# Patient Record
Sex: Male | Born: 1970 | Hispanic: Refuse to answer
Health system: Midwestern US, Community
[De-identification: ages and names within clinical notes are randomized; demographics above are authoritative.]

---

## 2019-07-24 LAB — BASIC METABOLIC PANEL
Anion Gap: 11 mmol/L (ref 2–17)
BUN: 13 mg/dL (ref 6–20)
CO2: 25 mmol/L (ref 22–29)
Calcium: 9.1 mg/dL (ref 8.6–10.0)
Chloride: 102 mmol/L (ref 98–107)
Creatinine: 1 mg/dL (ref 0.7–1.3)
GFR African American: 102 mL/min/{1.73_m2} (ref >=90)
GFR Non-African American: 88 mL/min/{1.73_m2} — ABNORMAL LOW (ref >=90)
Glucose: 135 mg/dL — ABNORMAL HIGH (ref 70–99)
OSMOLALITY CALCULATED: 278 mOsm/kg (ref 270–287)
Potassium: 4 mmol/L (ref 3.5–5.3)
Sodium: 138 mmol/L (ref 135–145)

## 2019-07-24 LAB — CBC WITH AUTO DIFFERENTIAL
Absolute Baso #: 0 10*3/uL (ref 0.0–0.2)
Absolute Eos #: 0.2 10*3/uL (ref 0.0–0.5)
Absolute Lymph #: 2.9 10*3/uL (ref 1.0–3.2)
Absolute Mono #: 0.6 10*3/uL (ref 0.3–1.0)
Basophils %: 0.2 % (ref 0.0–2.0)
Eosinophils %: 3 % (ref 0.0–7.0)
Hematocrit: 39.6 % (ref 38.0–52.0)
Hemoglobin: 12.8 g/dL — ABNORMAL LOW (ref 13.0–17.3)
Immature Grans (Abs): 0.04 10*3/uL (ref 0.00–0.06)
Immature Granulocytes: 0.5 % (ref 0.1–0.6)
Lymphocytes: 35.8 % (ref 15.0–45.0)
MCH: 22.1 pg — ABNORMAL LOW (ref 27.0–34.5)
MCHC: 32.3 g/dL (ref 32.0–36.0)
MCV: 68.3 fL — ABNORMAL LOW (ref 84.0–100.0)
MPV: 10.9 fL (ref 7.2–13.2)
Monocytes: 7.5 % (ref 4.0–12.0)
Neutrophils %: 53 % (ref 42.0–74.0)
Neutrophils Absolute: 4.3 10*3/uL (ref 1.6–7.3)
Platelets: 278 10*3/uL (ref 140–440)
RBC: 5.8 x10e6/mcL — ABNORMAL HIGH (ref 4.00–5.60)
RDW: 17.1 % — ABNORMAL HIGH (ref 11.0–16.0)
WBC: 8.1 10*3/uL (ref 3.8–10.6)

## 2019-07-24 LAB — MORPHOLOGY CHECK

## 2019-07-24 LAB — TROPONIN T
Troponin T: <0.01 ng/mL (ref 0.000–0.010)
Troponin T: <0.01 ng/mL (ref 0.000–0.010)

## 2019-07-24 NOTE — ED Notes (Signed)
ED Triage Note       ED Secondary Triage Entered On:  07/24/2019 0:32 EDT    Performed On:  07/24/2019 0:30 EDT by Jeral Pinch, RN, Loranne               General Information   Barriers to Learning :   None evident   ED Home Meds Section :   Document assessment   [facility] ED Fall Risk Section :   Document assessment   ED Advance Directives Section :   Document assessment   ED Palliative Screen :   N/A (prefilled for <49yo)   Creel, RN, Loranne - 07/24/2019 0:30 EDT   (As Of: 07/24/2019 00:32:16 EDT)   Diagnoses(Active)    Acute chest pain  Date:   07/24/2019 ; Diagnosis Type:   Discharge ; Confirmation:   Confirmed ; Clinical Dx:   Acute chest pain ; Classification:   Medical ; Clinical Service:   Non-Specified ; Code:   ICD-10-CM ; Probability:   0 ; Diagnosis Code:   R07.9      Chest pain  Date:   07/24/2019 ; Diagnosis Type:   Reason For Visit ; Confirmation:   Complaint of ; Clinical Dx:   Chest pain ; Classification:   Medical ; Clinical Service:   Emergency medicine ; Code:   PNED ; Probability:   0 ; Diagnosis Code:   [id number]             -    Procedure History   (As Of: 07/24/2019 00:32:16 EDT)     Phoebe Perch Fall Risk Assessment Tool   Hx of falling last 3 months ED Fall :   No   Patient confused or disoriented ED Fall :   No   Patient intoxicated or sedated ED Fall :   No   Patient impaired gait ED Fall :   No   Use a mobility assistance device ED Fall :   No   Patient altered elimination ED Fall :   No   [facility] ED Fall Score :   0    Creel, RN, Loranne - 07/24/2019 0:30 EDT   ED Advance Directive   Advance Directive :   No   Creel, RN, Jerold Coombe - 07/24/2019 0:30 EDT   Med Hx   Medication List   (As Of: 07/24/2019 00:32:16 EDT)   Normal Order    ketorolac 15 mg/mL Inj Soln 1 mL  :   ketorolac 15 mg/mL Inj Soln 1 mL ; Status:   Completed ; Ordered As Mnemonic:   Toradol ; Simple Display Line:   15 mg, 1 mL, IV Push, Once ; Ordering Provider:   Bernestine Amass; Catalog Code:    ketorolac ; Order Dt/Tm:   07/24/2019 00:22:51 EDT          Sodium Chloride 0.9% intravenous solution Bolus  :   Sodium Chloride 0.9% intravenous solution Bolus ; Status:   Completed ; Ordered As Mnemonic:   Sodium Chloride 0.9% bolus ; Simple Display Line:   1,000 mL, 2000 mL/hr, IV Piggyback, Once ; Ordering Provider:   Bernestine Amass; Catalog Code:   Sodium Chloride 0.9% ; Order Dt/Tm:   07/24/2019 00:22:51 EDT            Home Meds    aspirin  :   aspirin ; Status:   Documented ; Ordered As Mnemonic:   aspirin ; Simple Display Line:   325 mg, Oral, Once, 0 Refill(s) ;  Catalog Code:   aspirin ; Order Dt/Tm:   07/24/2019 00:31:21 EDT          atorvastatin  :   atorvastatin ; Status:   Documented ; Ordered As Mnemonic:   atorvastatin ; Simple Display Line:   Oral, Daily, 0 Refill(s) ; Catalog Code:   atorvastatin ; Order Dt/Tm:   07/24/2019 64:40:34 EDT          folic acid  :   folic acid ; Status:   Documented ; Ordered As Mnemonic:   folic acid ; Simple Display Line:   Daily, 0 Refill(s) ; Catalog Code:   folic acid ; Order Dt/Tm:   07/24/2019 00:31:28 EDT          nitroglycerin  :   nitroglycerin ; Status:   Documented ; Ordered As Mnemonic:   nitroglycerin ; Simple Display Line:   0 Refill(s) ; Catalog Code:   nitroglycerin ; Order Dt/Tm:   07/24/2019 00:31:47 EDT

## 2019-07-24 NOTE — ED Notes (Signed)
 ED Patient Summary       ;       [facility] Emergency Department  [address] [place], [place] [address]  [phone]  Discharge Instructions (Patient)  _______________________________________     Colin Williams  DOB:  December 17, 1970                   MRN: [id number]                   FIN: [id number]  Reason For Visit: Chest pain; CHEST PAIN  Final Diagnosis: Acute chest pain     Visit Date: 07/24/2019 00:19:00  Address: [address] [place] [address]  Phone: [phone]     Emergency Department Providers:         Primary Physician:   ROYDEN Colin Williams         [facility] would like to thank you for allowing us  to assist you with your healthcare needs. The following includes patient education materials and information regarding your injury/illness.     Follow-up Instructions:  You were seen today on an emergency basis. Please contact your primary care doctor for a follow up appointment. If you received a referral to a specialist doctor, it is important you follow-up as instructed.    It is important that you call your follow-up doctor to schedule and confirm the location of your next appointment. Your doctor may practice at multiple locations. The office location of your follow-up appointment may be different to the one written on your discharge instructions.    If you do not have a primary care doctor, please call [phone] for help in finding a [facility] Provider. For help in finding a specialist doctor, please call [phone].    The [facility] "Ask a Nurse" line in staffed by Registered Nurses and is a free service to the community. We are available Monday - Friday from 8am to 5pm to answer your questions about your health. Please call [phone].    If your condition gets worse before your follow-up with your primary care doctor or specialist, please return to the Emergency Department.        Follow Up  Appointments:  Primary Care Provider:      Name: PCP,  UNKNOWN      Phone:                  With: Address: When:   primary care physician or clinic  Within 2 to 4 days   Comments:   Please follow up with your primary care doctor as soon as possible.     If you do not have a primary care doctor, please call [facility] Referral line at [phone]     Please return for any worsening or new concerning symptoms.       With: Address: When:   [facility] Call for appt & office location   [phone] Business (1) Within 1 to 2 days              Printed Prescriptions:    Patient Education Materials:  Discharge Orders          Discharge Patient 07/24/19 2:53:00 EDT         Comment:      Nonspecific Chest Pain, Easy-to-Read     Nonspecific Chest Pain    It is often hard to find the cause of chest pain. There  is always a chance that your pain could be related to something serious, such as a heart attack or a blood clot in your lungs. Chest pain can also be caused by conditions that are not life-threatening. If you have chest pain, it is very important to follow up with your doctor.          HOME CARE     If you were prescribed an antibiotic medicine, finish it all even if you start to feel better.     Avoid any activities that cause chest pain.     Do not use any tobacco products, including cigarettes, chewing tobacco, or electronic cigarettes. If you need help quitting, ask your doctor.     Do not drink alcohol.     Take medicines only as told by your doctor.     Keep all follow-up visits as told by your doctor. This is important. This includes any further testing if your chest pain does not go away.     Your doctor may tell you to keep your head raised (elevated) while you sleep.     Make lifestyle changes as told by your doctor. These may include:    ? Getting regular exercise. Ask your doctor to suggest some activities that are safe for you.    ? Eating a heart-healthy diet. Your doctor or a diet specialist  (dietitian) can help you to learn healthy eating options.    ? Maintaining a healthy weight.    ? Managing diabetes, if necessary.    ? Reducing stress.    GET HELP IF:     Your chest pain does not go away, even after treatment.     You have a rash with blisters on your chest.     You have a fever.    GET HELP RIGHT AWAY IF:     Your chest pain is worse.      You have an increasing cough, or you cough up blood.     You have severe belly (abdominal) pain.     You feel extremely weak.     You pass out (faint).     You have chills.     You have sudden, unexplained chest discomfort.     You have sudden, unexplained discomfort in your arms, back, neck, or jaw.     You have shortness of breath at any time.     You suddenly start to sweat, or your skin gets clammy.     You feel nauseous.     You vomit.     You suddenly feel light-headed or dizzy.     Your heart begins to beat quickly, or it feels like it is skipping beats.    These symptoms may be an emergency. Do not wait to see if the symptoms will go away. Get medical help right away. Call your local emergency services (911 in the [place] Do not drive yourself to the hospital.    This information is not intended to replace advice given to you by your health care provider. Make sure you discuss any questions you have with your health care provider.    Document Released: 03/01/2008 Document Revised: 10/04/2014 Document Reviewed: 04/19/2014  Elsevier Interactive Patient Education ?2016 Elsevier Inc.         Allergy Info: No Known Allergies     Medication Information:  [facility] ED Physicians provided you with a complete list of medications post discharge, if you have been instructed to  stop taking a medication please ensure you also follow up with this information to your Primary Care Physician.  Unless otherwise noted, patient will continue to take medications as prescribed prior to the Emergency Room visit.  Any specific questions regarding your chronic  medications and dosages should be discussed with your physician(s) and pharmacist.          aspirin 325 Milligram Oral (given by mouth) once.  atorvastatin Oral (given by mouth) every day.  folic acid every day.  naproxen (Naprosyn 500 mg oral tablet) 1 Tabs Oral (given by mouth) 2 times a day for 7 Days. Refills: 0.  nitroglycerin      Medications Administered During Visit:              Medication Dose Route   ketorolac 15 mg IV Push   Sodium Chloride 0.9% 1000 mL IV Piggyback   morphine 4 mg IV Push          Major Tests and Procedures:  The following procedures and tests were performed during your ED visit.  COMMON PROCEDURES%>  COMMON PROCEDURES COMMENTS%>          Laboratory Orders  Name Status Details   BMP Completed Blood, Stat, ST - Stat, 07/24/19 0:21:00 EDT, 07/24/19 0:21:00 EDT, Nurse collect, KWON-MD,  Colin Williams, Print label Y/N   CBCDIFF Completed Blood, Stat, ST - Stat, 07/24/19 0:21:00 EDT, 07/24/19 0:21:00 EDT, Nurse collect, ROYDEN,  Colin Williams, Print label Y/N   Morphology Review Completed Blood, Stat, ST - Stat, 07/24/19 0:21:00 EDT, 07/24/19 0:21:00 EDT, Nurse collect, 07/24/19 0:24:00 US Robinette ROYDEN Colin Williams, [phone]   Trop T Completed Blood, Routine, RT - Routine, 07/24/19 2:21:00 EDT, 07/24/19 2:21:00 EDT, Nurse collect, KWON-MD,  Colin Williams, Print label Y/N   Trop T Completed Blood, Stat, ST - Stat, 07/24/19 0:21:00 EDT, 07/24/19 0:21:00 EDT, Nurse collect, ROYDEN,  Colin Williams, Print label Y/N               Radiology Orders  Name Status Details   XR Chest 1 View Portable Ordered 07/24/19 0:21:00 EDT, STAT 1 hour or less, Reason: Chest pain, Transport Mode: PORTABLE, pp_set_radiology_subspecialty               Patient Care Orders  Name Status Details   Blood Pressure Completed 07/24/19 0:21:00 EDT, Once, 07/24/19 0:21:00 EDT, Check BP both arms   Cardiac/NIBP/Pulse Ox Monitoring Completed 07/24/19 0:21:00 EDT, This message can only be seen by  Nursing, it is not visible to Pharmacy, Laboratory, or Radiology., 07/24/19 0:21:00 EDT, 07/24/19 0:21:00 EDT, Once   Discharge Patient Ordered 07/24/19 2:53:00 EDT   ED Assessment Adult Completed 07/24/19 0:23:51 EDT, 07/24/19 0:23:51 EDT   ED Only Oxygen Therapy Completed 07/24/19 0:21:00 EDT, STAT 1 hour or less, 07/24/19 0:21:00 EDT, Keep SAT > 92%   ED Secondary Triage Completed 07/24/19 0:23:51 EDT, 07/24/19 0:23:51 EDT   ED Triage Adult Completed 07/24/19 0:19:56 EDT, 07/24/19 0:19:56 EDT   Saline Lock Insert Completed 07/24/19 0:21:00 EDT, Once, 07/24/19 0:21:00 EDT       ---------------------------------------------------------------------------------------------------------------------  [facility]) encourages you to self-enroll in the [facility] Patient Portal.  [facility] Patient Portal will allow you to manage your personal health information securely from your own electronic device now and in the future.  To begin your Patient Portal enrollment process, please visit [email or web address] Click on "Sign up now" under [facility].  If you find that you need additional assistance on the [facility] Patient Portal or need a copy of your medical records, please call the [facility] Medical Records Office at [phone].  Comment:

## 2019-07-24 NOTE — ED Provider Notes (Signed)
chest pain *ED        Patient:   Colin Williams, Colin Williams             MRN: [id number]            FIN: [id number]               Age:   48 years     Sex:  Male     DOB:  [birth year]   Associated Diagnoses:   Acute chest pain   Author:   Adora Fridge      Basic Information   Time seen: Provider Seen (ST)   ED Provider/Time:    Adora Fridge / 07/24/2019 00:21  .   Additional information: Chief Complaint from Nursing Triage Note   Chief Complaint  Chief Complaint: Pt c/o substernal CP with SOB since 1300 on 10/26.  Pt given NTG and ASA per medics. (07/24/19 00:20:00).      History of Present Illness   The patient presents with 48 year old male past medical history of nonobstructive CAD on cath on 10/2018 presenting with left-sided chest pain since 1 PM.  Started at rest, not better or worse with anything.  No radiation, has some mild sob, no other associated symptoms. BIBEMS, gave aspirin and nitro, pt had no relief and symptoms still persist. No history of blood clots, no long travels or recent surgeries, no OCPs, no leg pain or swelling. Denies headache, abdominal pain, fevers, chills, cough, runny nose, dysuria, bleeding from anywhere..  The onset was 10  hours ago.  The course/duration of symptoms is fluctuating in intensity.  Location: chest. The character of symptoms is pain.  The degree at onset was minimal.  The degree at present is minimal.        Review of Systems   Constitutional symptoms:  Negative except as documented in HPI.   Skin symptoms:  Negative except as documented in HPI.   Eye symptoms:  Negative except as documented in HPI.   ENMT symptoms:  Negative except as documented in HPI.   Respiratory symptoms:  Negative except as documented in HPI.   Cardiovascular symptoms:  Negative except as documented in HPI.   Gastrointestinal symptoms:  Negative except as documented in HPI.   Musculoskeletal symptoms:  Negative except as documented in HPI.   Neurologic symptoms:  Negative except as  documented in HPI.   Psychiatric symptoms:  Negative except as documented in HPI.             Additional review of systems information: All other systems reviewed and otherwise negative.      Health Status   Allergies:    Allergic Reactions (Selected)  No Known Allergies.      Past Medical/ Family/ Social History   Medical history: Reviewed as documented in chart.   Surgical history: Reviewed as documented in chart.   Family history: Not significant.   Social history: Reviewed as documented in chart.   Problem list:    No qualifying data available  .      Physical Examination               Vital Signs   Vital Signs   07/24/2535 6:44 EDT Systolic Blood Pressure 034 mmHg (Modified)    Diastolic Blood Pressure 82 mmHg     Heart Rate Monitored 73 bpm     Respiratory Rate 12 br/min     SpO2 98 %    74/25/9563 8:75 EDT Systolic Blood Pressure  474 mmHg     Diastolic Blood Pressure 76 mmHg     Temperature Oral 36.7 degC     Heart Rate Monitored 77 bpm     Respiratory Rate 16 br/min     SpO2 99 %    .   Measurements   07/24/2019 0:23 EDT Body Mass Index est meas 43.05 kg/m2    Body Mass Index Measured 43.05 kg/m2   07/24/2019 0:20 EDT Height/Length Measured 178 cm    Weight Dosing 136.4 kg   .   Basic Oxygen Information   07/24/2019 0:25 EDT SpO2 98 %    Oxygen Therapy Room air   07/24/2019 0:20 EDT SpO2 99 %    Oxygen Therapy Room air   .   General:  Alert, no acute distress, Not ill-appearing,    Skin:  Warm, dry.    Head:  Normocephalic.   Neck:  Supple.   Eye:  Normal conjunctiva, vision grossly normal.    Cardiovascular:  Normal peripheral perfusion, No edema.    Respiratory:  Respirations are non-labored, Symmetrical chest wall expansion.    Chest wall:  No deformity.   Back:  Normal range of motion.   Musculoskeletal:  Normal ROM, normal strength.    Gastrointestinal:  Non distended.   Neurological:  Normal motor observed, normal speech observed.    Psychiatric:  Cooperative, appropriate mood & affect.       Medical  Decision Making   Documents reviewed:  Emergency department nurses' notes, emergency department records, prior records.    Electrocardiogram:  Emergency Provider interpretation performed by me, normal sinus rhythm, No ST-T changes, no ectopy, normal PR & QRS intervals, Clinical impression: Sinus rhythm without evidence of acute ischemia. Visualized and interpreted by me.    Results review:  Lab results : Lab View   07/24/2019 2:31 EDT Trop T Quant <0.010 ng/mL   07/24/2019 0:59 EDT Estimated Creatinine Clearance 124.45 mL/min   07/24/2019 0:24 EDT WBC 8.1 x10e3/mcL    RBC 5.80 x10e6/mcL  HI    Hgb 12.8 g/dL  LOW    HCT 39.6 %    MCV 68.3 fL  LOW    MCH 22.1 pg  LOW    MCHC 32.3 g/dL    RDW 17.1 %  HI    Platelet 278 x10e3/mcL    MPV 10.9 fL    Neutro Auto 53.0 %    Neutro Absolute 4.3 x10e3/mcL    Immature Grans Percent 0.5 %    Immature Grans Absolute 0.04 x10e3/mcL    Lymph Auto 35.8 %    Lymph Absolute 2.9 x10e3/mcL    Mono Auto 7.5 %    Mono Absolute 0.6 x10e3/mcL    Eosinophil Percent 3.0 %    Eos Absolute 0.2 x10e3/mcL    Basophil Auto 0.2 %    Baso Absolute 0.0 x10e3/mcL    Anisocytosis Few    Crenated cells Few    Giant platelets Few    Hypochromia Few    Microcytes Few    Ovalocytes Few    Platelet Clumps Clumped    RBC morphology Not Indicated    Rouleaux Present    Sodium Lvl 138 mmol/L    Potassium Lvl 4.0 mmol/L    Chloride 102 mmol/L    CO2 25 mmol/L    Glucose Random 135 mg/dL  HI    BUN 13 mg/dL    Creatinine Lvl 1.0 mg/dL    AGAP 11 mmol/L    Osmolality Calc 278 mOsm/kg  Calcium Lvl 9.1 mg/dL    eGFR AA 102 mL/min/1.28m???    eGFR Non-AA 88 mL/min/1.746m??  LOW    Trop T Quant <0.010 ng/mL   .      Reexamination/ Reevaluation   Vital signs   Basic Oxygen Information   07/24/2019 0:25 EDT SpO2 98 %    Oxygen Therapy Room air   07/24/2019 0:20 EDT SpO2 99 %    Oxygen Therapy Room air      Heart score 2, PERC 0, pain resolved after medications here. labs including trop x2 normal. CXR clear. ekg  nonischemic.    Repeat exam at time of d/c benign. VSS, patient appears non-toxic and is in no acute distress. Doubt serious emergency process at this time.   All results and findings were discussed with the patient.  All questions were answered. Plan of care was discussed and a decision was made together to go home. Patient understands and agrees with plan.  Agrees to follow up with provider immediately. Patient given strict return precautions. Stable for discharge and to return for any worsening or new concerning symptoms.        Impression and Plan   Diagnosis   Acute chest pain (ICD10-CM R07.9, Discharge, Medical)   Plan   Condition: Improved.    Disposition: Discharged: Time  07/24/2019 02:53:00, to home.    Prescriptions: Launch prescriptions   Pharmacy:  Naprosyn 500 mg oral tablet (Prescribe): 500 mg, 1 tabs, Oral, BID, for 7 days, 14 tabs, 0 Refill(s).    Patient was given the following educational materials: Nonspecific Chest Pain, Easy-to-Read, Nonspecific Chest Pain, Easy-to-Read.    [facility] Within 1 to 2 days; primary care physician or clinic Within 2 to 4 days Please follow up with your primary care doctor as soon as possible.    If you do not have a primary care doctor, please call [facility] Referral line at [phone]    Please return for any worsening or new concerning symptoms..    Counseled: Patient, Regarding diagnosis, Regarding diagnostic results, Regarding treatment plan, Patient indicated understanding of instructions.    Signature Line     Electronically Signed on 07/24/2019 02:57 AM EDT   ________________________________________________   KWAdora Fridge             Modified by: KWAdora Fridgen 07/24/2019 12:45 AM EDT      Modified by: KWAdora Fridgen 07/24/2019 02:53 AM EDT      Modified by: KWAdora Fridgen 07/24/2019 02:57 AM EDT

## 2019-07-24 NOTE — ED Notes (Signed)
ED Patient Education Note     Patient Education Materials Follows:  Cardiovascular     Nonspecific Chest Pain    It is often hard to find the cause of chest pain. There is always a chance that your pain could be related to something serious, such as a heart attack or a blood clot in your lungs. Chest pain can also be caused by conditions that are not life-threatening. If you have chest pain, it is very important to follow up with your doctor.          HOME CARE     If you were prescribed an antibiotic medicine, finish it all even if you start to feel better.     Avoid any activities that cause chest pain.     Do not use any tobacco products, including cigarettes, chewing tobacco, or electronic cigarettes. If you need help quitting, ask your doctor.     Do not drink alcohol.     Take medicines only as told by your doctor.     Keep all follow-up visits as told by your doctor. This is important. This includes any further testing if your chest pain does not go away.     Your doctor may tell you to keep your head raised (elevated) while you sleep.     Make lifestyle changes as told by your doctor. These may include:    ? Getting regular exercise. Ask your doctor to suggest some activities that are safe for you.    ? Eating a heart-healthy diet. Your doctor or a diet specialist (dietitian) can help you to learn healthy eating options.    ? Maintaining a healthy weight.    ? Managing diabetes, if necessary.    ? Reducing stress.    GET HELP IF:     Your chest pain does not go away, even after treatment.     You have a rash with blisters on your chest.     You have a fever.    GET HELP RIGHT AWAY IF:     Your chest pain is worse.      You have an increasing cough, or you cough up blood.     You have severe belly (abdominal) pain.     You feel extremely weak.     You pass out (faint).     You have chills.     You have sudden, unexplained chest discomfort.     You have sudden, unexplained discomfort in your arms, back, neck,  or jaw.     You have shortness of breath at any time.     You suddenly start to sweat, or your skin gets clammy.     You feel nauseous.     You vomit.     You suddenly feel light-headed or dizzy.     Your heart begins to beat quickly, or it feels like it is skipping beats.    These symptoms may be an emergency. Do not wait to see if the symptoms will go away. Get medical help right away. Call your local emergency services (911 in the [place] Do not drive yourself to the hospital.    This information is not intended to replace advice given to you by your health care provider. Make sure you discuss any questions you have with your health care provider.    Document Released: 03/01/2008 Document Revised: 10/04/2014 Document Reviewed: 04/19/2014  Elsevier Interactive Patient Education ?2016 Elsevier Inc.

## 2019-07-24 NOTE — Discharge Summary (Signed)
 ED Clinical Summary                     [facility] : [facility]  [address] [place], [place] [address]  [phone]          PERSON INFORMATION  Name: Colin Williams, Colin Williams Age:  48 Years DOB: Feb 06, 1971   Sex: Male Language: English PCP: PCP,  UNKNOWN   Marital Status: Married Phone: [phone] Med Service: MED-Medicine   MRN: [id number] Acct# [id number] Arrival: 07/24/2019 00:19:00   Visit Reason: Chest pain; CHEST PAIN Acuity: 3 LOS: 000 02:52   Address:    [address] [place] [address]   Diagnosis:    Acute chest pain  Medications:          New Medications  Printed Prescriptions  naproxen (Naprosyn 500 mg oral tablet) 1 Tabs Oral (given by mouth) 2 times a day for 7 Days. Refills: 0.  Last Dose:____________________  Medications that have not changed  Other Medications  aspirin 325 Milligram Oral (given by mouth) once.  Last Dose:____________________  atorvastatin Oral (given by mouth) every day.  Last Dose:____________________  folic acid every day.  Last Dose:____________________  nitroglycerin   Last Dose:____________________      Medications Administered During Visit:                Medication Dose Route   ketorolac 15 mg IV Push   Sodium Chloride 0.9% 1000 mL IV Piggyback   morphine 4 mg IV Push               Allergies      No Known Allergies      Major Tests and Procedures:  The following procedures and tests were performed during your ED visit.  COMMON PROCEDURES%>  COMMON PROCEDURES COMMENTS%>                PROVIDER INFORMATION               Provider Role Assigned Sampson November, RN, Jena ED Nurse 07/24/2019 00:20:09    ROYDEN DONNICE LIBERTY ED Provider 07/24/2019 00:21:07        Attending Physician:  ROYDEN DONNICE LIBERTY      Admit Doc  KWON-MD,  DONNICE LIBERTY     Consulting Doc       VITALS INFORMATION  Vital Sign Triage Latest   Temp Oral ORAL_1%> ORAL%>   Temp Temporal TEMPORAL_1%> TEMPORAL%>   Temp Intravascular INTRAVASCULAR_1%> INTRAVASCULAR%>   Temp  Axillary AXILLARY_1%> AXILLARY%>   Temp Rectal RECTAL_1%> RECTAL%>   02 Sat 99 % 94 %   Respiratory Rate RATE_1%> RATE%>   Peripheral Pulse Rate PULSE RATE_1%>70 bpm PULSE RATE%>   Apical Heart Rate HEART RATE_1%> HEART RATE%>   Blood Pressure BLOOD PRESSURE_1%>/ BLOOD PRESSURE_1%>76 mmHg BLOOD PRESSURE%> / BLOOD PRESSURE%>74 mmHg                 Immunizations      No Immunizations Documented This Visit          DISCHARGE INFORMATION   Discharge Disposition: H Outpt-Sent Home   Discharge Location:  Home   Discharge Date and Time:  07/24/2019 03:11:11   ED Checkout Date and Time:  07/24/2019 03:11:11     DEPART REASON INCOMPLETE INFORMATION               Depart Action Incomplete Reason   Interactive View/I&O Recently assessed  Problems      No Problems Documented              Smoking Status      No Smoking Status Documented         PATIENT EDUCATION INFORMATION  Instructions:     Nonspecific Chest Pain, Easy-to-Read     Follow up:                   With: Address: When:   primary care physician or clinic  Within 2 to 4 days   Comments:   Please follow up with your primary care doctor as soon as possible.     If you do not have a primary care doctor, please call [facility] Referral line at [phone]     Please return for any worsening or new concerning symptoms.       With: Address: When:   [facility] Call for appt & office location   [phone] Business (1) Within 1 to 2 days              ED PROVIDER DOCUMENTATION     Patient:   Colin Williams, Colin Williams             MRN: [id number]            FIN: [id number]               Age:   30 years     Sex:  Male     DOB:  05-23-1971   Associated Diagnoses:   Acute chest pain   Author:   ROYDEN DONNICE LIBERTY      Basic Information   Time seen: Provider Seen (ST)   ED Provider/Time:    ROYDEN DONNICE LIBERTY / 07/24/2019 00:21  .   Additional information: Chief Complaint from Nursing Triage Note   Chief Complaint  Chief Complaint: Pt c/o substernal CP with SOB since  1300 on 10/26.  Pt given NTG and ASA per medics. (07/24/19 00:20:00).      History of Present Illness   The patient presents with 48 year old male past medical history of nonobstructive CAD on cath on 10/2018 presenting with left-sided chest pain since 1 PM.  Started at rest, not better or worse with anything.  No radiation, has some mild sob, no other associated symptoms. BIBEMS, gave aspirin and nitro, pt had no relief and symptoms still persist. No history of blood clots, no long travels or recent surgeries, no OCPs, no leg pain or swelling. Denies headache, abdominal pain, fevers, chills, cough, runny nose, dysuria, bleeding from anywhere..  The onset was 10  hours ago.  The course/duration of symptoms is fluctuating in intensity.  Location: chest. The character of symptoms is pain.  The degree at onset was minimal.  The degree at present is minimal.        Review of Systems   Constitutional symptoms:  Negative except as documented in HPI.   Skin symptoms:  Negative except as documented in HPI.   Eye symptoms:  Negative except as documented in HPI.   ENMT symptoms:  Negative except as documented in HPI.   Respiratory symptoms:  Negative except as documented in HPI.   Cardiovascular symptoms:  Negative except as documented in HPI.   Gastrointestinal symptoms:  Negative except as documented in HPI.   Musculoskeletal symptoms:  Negative except as documented in HPI.   Neurologic symptoms:  Negative except as documented in HPI.   Psychiatric symptoms:  Negative  except as documented in HPI.             Additional review of systems information: All other systems reviewed and otherwise negative.      Health Status   Allergies:    Allergic Reactions (Selected)  No Known Allergies.      Past Medical/ Family/ Social History   Medical history: Reviewed as documented in chart.   Surgical history: Reviewed as documented in chart.   Family history: Not significant.   Social history: Reviewed as documented in chart.   Problem  list:    No qualifying data available  .      Physical Examination               Vital Signs   Vital Signs   07/24/2019 0:25 EDT Systolic Blood Pressure 138 mmHg (Modified)    Diastolic Blood Pressure 82 mmHg     Heart Rate Monitored 73 bpm     Respiratory Rate 12 br/min     SpO2 98 %    07/24/2019 0:20 EDT Systolic Blood Pressure 136 mmHg     Diastolic Blood Pressure 76 mmHg     Temperature Oral 36.7 degC     Heart Rate Monitored 77 bpm     Respiratory Rate 16 br/min     SpO2 99 %    .   Measurements   07/24/2019 0:23 EDT Body Mass Index est meas 43.05 kg/m2    Body Mass Index Measured 43.05 kg/m2   07/24/2019 0:20 EDT Height/Length Measured 178 cm    Weight Dosing 136.4 kg   .   Basic Oxygen Information   07/24/2019 0:25 EDT SpO2 98 %    Oxygen Therapy Room air   07/24/2019 0:20 EDT SpO2 99 %    Oxygen Therapy Room air   .   General:  Alert, no acute distress, Not ill-appearing,    Skin:  Warm, dry.    Head:  Normocephalic.   Neck:  Supple.   Eye:  Normal conjunctiva, vision grossly normal.    Cardiovascular:  Normal peripheral perfusion, No edema.    Respiratory:  Respirations are non-labored, Symmetrical chest wall expansion.    Chest wall:  No deformity.   Back:  Normal range of motion.   Musculoskeletal:  Normal ROM, normal strength.    Gastrointestinal:  Non distended.   Neurological:  Normal motor observed, normal speech observed.    Psychiatric:  Cooperative, appropriate mood & affect.       Medical Decision Making   Documents reviewed:  Emergency department nurses' notes, emergency department records, prior records.    Electrocardiogram:  Emergency Provider interpretation performed by me, normal sinus rhythm, No ST-T changes, no ectopy, normal PR & QRS intervals, Clinical impression: Sinus rhythm without evidence of acute ischemia. Visualized and interpreted by me.    Results review:  Lab results : Lab View   07/24/2019 2:31 EDT Trop T Quant <0.010 ng/mL   07/24/2019 0:59 EDT Estimated Creatinine Clearance  124.45 mL/min   07/24/2019 0:24 EDT WBC 8.1 x10e3/mcL    RBC 5.80 x10e6/mcL  HI    Hgb 12.8 g/dL  LOW    HCT 60.3 %    MCV 68.3 fL  LOW    MCH 22.1 pg  LOW    MCHC 32.3 g/dL    RDW 82.8 %  HI    Platelet 278 x10e3/mcL    MPV 10.9 fL    Neutro Auto 53.0 %    Neutro Absolute  4.3 x10e3/mcL    Immature Grans Percent 0.5 %    Immature Grans Absolute 0.04 x10e3/mcL    Lymph Auto 35.8 %    Lymph Absolute 2.9 x10e3/mcL    Mono Auto 7.5 %    Mono Absolute 0.6 x10e3/mcL    Eosinophil Percent 3.0 %    Eos Absolute 0.2 x10e3/mcL    Basophil Auto 0.2 %    Baso Absolute 0.0 x10e3/mcL    Anisocytosis Few    Crenated cells Few    Giant platelets Few    Hypochromia Few    Microcytes Few    Ovalocytes Few    Platelet Clumps Clumped    RBC morphology Not Indicated    Rouleaux Present    Sodium Lvl 138 mmol/L    Potassium Lvl 4.0 mmol/L    Chloride 102 mmol/L    CO2 25 mmol/L    Glucose Random 135 mg/dL  HI    BUN 13 mg/dL    Creatinine Lvl 1.0 mg/dL    AGAP 11 mmol/L    Osmolality Calc 278 mOsm/kg    Calcium Lvl 9.1 mg/dL    eGFR AA 897 fO/fpw/8.26f    eGFR Non-AA 88 mL/min/1.53m  LOW    Trop T Quant <0.010 ng/mL   .      Reexamination/ Reevaluation   Vital signs   Basic Oxygen Information   07/24/2019 0:25 EDT SpO2 98 %    Oxygen Therapy Room air   07/24/2019 0:20 EDT SpO2 99 %    Oxygen Therapy Room air      Heart score 2, PERC 0, pain resolved after medications here. labs including trop x2 normal. CXR clear. ekg nonischemic.    Repeat exam at time of d/c benign. VSS, patient appears non-toxic and is in no acute distress. Doubt serious emergency process at this time.   All results and findings were discussed with the patient.  All questions were answered. Plan of care was discussed and a decision was made together to go home. Patient understands and agrees with plan.  Agrees to follow up with provider immediately. Patient given strict return precautions. Stable for discharge and to return for any worsening or new concerning  symptoms.        Impression and Plan   Diagnosis   Acute chest pain (ICD10-CM R07.9, Discharge, Medical)   Plan   Condition: Improved.    Disposition: Discharged: Time  07/24/2019 02:53:00, to home.    Prescriptions: [facility] prescriptions   Pharmacy:  Naprosyn 500 mg oral tablet (Prescribe): 500 mg, 1 tabs, Oral, BID, for 7 days, 14 tabs, 0 Refill(s).    Patient was given the following educational materials: Nonspecific Chest Pain, Easy-to-Read, Nonspecific Chest Pain, Easy-to-Read.    Follow up with: [facility] Within 1 to 2 days; primary care physician or clinic Within 2 to 4 days Please follow up with your primary care doctor as soon as possible.    If you do not have a primary care doctor, please call [facility] Referral line at [phone]    Please return for any worsening or new concerning symptoms..    Counseled: Patient, Regarding diagnosis, Regarding diagnostic results, Regarding treatment plan, Patient indicated understanding of instructions.

## 2019-07-24 NOTE — ED Notes (Signed)
ED Triage Note       ED Triage Adult Entered On:  07/24/2019 0:23 EDT    Performed On:  07/24/2019 0:20 EDT by Jeral Pinch, RN, Loranne               Triage   Chief Complaint :   Pt c/o substernal CP with SOB since 1300 on 10/26.  Pt given NTG and ASA per medics.   Numeric Rating Pain Scale :   5 = Moderate pain   [facility] Mode of Arrival :   Ambulance   Infectious Disease Documentation :   Document assessment   Temperature Oral :   36.7 degC(Converted to: 98.1 degF)    Heart Rate Monitored :   77 bpm   Respiratory Rate :   16 br/min   Systolic Blood Pressure :   136 mmHg   Diastolic Blood Pressure :   76 mmHg   SpO2 :   99 %   Oxygen Therapy :   Room air   Patient presentation :   None of the above   Chief Complaint or Presentation suggest infection :   No   Weight Dosing :   136.4 kg(Converted to: 300 lb 11 oz)    Height :   178 cm(Converted to: 5 ft 10 in)    Body Mass Index Dosing :   43 kg/m2   Creel, RN, Loranne - 07/24/2019 0:20 EDT   DCP GENERIC CODE   Tracking Acuity :   3   Tracking Group :   ED [employer]   Bartelso, RN, Jerold Coombe - 07/24/2019 0:20 EDT   ED General Section :   Document assessment   Pregnancy Status :   N/A   ED Allergies Section :   Document assessment   ED Reason for Visit Section :   Document assessment   ED Quick Assessment :   Patient appears awake, alert, oriented to baseline. Skin warm and dry. Moves all extremities. Respiration even and unlabored. Appears in no apparent distress.   Jeral Pinch, RN, Jerold Coombe - 07/24/2019 0:20 EDT   PTA/Triage Treatments   ED PTA Pre-Arrival Service :   [facility]   ED PTA Medic Procedures :   EKG-other   ED PTA MEDICATIONS :   ASA, NTG   Creel, RN, Jerold Coombe - 07/24/2019 0:20 EDT   ID Risk Screen Symptoms   Recent Travel History :   No recent travel   Close Contact with COVID-19 ID :   No   Last 14 days COVID-19 ID :   No   Creel, RN, Loranne - 07/24/2019 0:20 EDT   Allergies   (As Of: 07/24/2019 00:23:50 EDT)   Allergies (Active)   No Known  Allergies  Estimated Onset Date:   Unspecified ; Created By:   Oran Rein; Reaction Status:   Active ; Category:   Drug ; Substance:   No Known Allergies ; Type:   Allergy ; Updated By:   Oran Rein; Reviewed Date:   07/24/2019 0:22 EDT        Psycho-Social   Last 3 mo, thoughts killing self/others :   Patient denies   Creel, RN, Jerold Coombe - 07/24/2019 0:20 EDT   ED Reason for Visit   (As Of: 07/24/2019 00:23:50 EDT)   Diagnoses(Active)    Chest pain  Date:   07/24/2019 ; Diagnosis Type:   Reason For Visit ; Confirmation:   Complaint of ; Clinical Dx:   Chest  pain ; Classification:   Medical ; Clinical Service:   Emergency medicine ; Code:   PNED ; Probability:   0 ; Diagnosis Code:   [id number]

## 2019-07-24 NOTE — ED Notes (Signed)
ED Pre-Arrival Note        Pre-Arrival Summary    Name:  Colin Williams,    Current Date:  07/24/2019 00:30:10 EDT  Gender:  Male  Date of Birth:    Age:  48  Pre-Arrival Type:  EMS  ETA:  07/24/2019 00:42:00 EDT  Primary Care Physician:    Presenting Problem:  chest pain  Pre-Arrival User:  Mitzi Davenport  Referring Source:    Full Name:  chest pain  Location:  01            PreArrival Communication Form  Emergency Department         Additional Patient Information: nitro, ASA      Orders:  [    ] CBC                                            [     ] CT Head no contrast  [    ] BMP                                           [     ] CT Abdomen/Pelvis no contrast  [    ] PT/INR                                       [     ] CT Abdomen/Pelvis IV contrast, w/ oral contrast  [    ] Troponin                                   [     ] CT Abdomen/Pelvis IV contrast, no oral contrast  [    ] BNP                                            [     ] See ordersheet  [    ] CXR                                             [     ] Other:__________________________  [    ] EKG

## 2019-10-03 IMAGING — MR MR LUMBAR SPINE W/O CM
4 of 6 series · 25 of 48 positions shown · non-contrast
Comparison: None.

CLINICAL DATA: Low back pain radiating to the left leg with
weakness and numbness.

EXAM:
MRI LUMBAR SPINE WITHOUT CONTRAST
TECHNIQUE: Multiplanar, multisequence MR imaging of the lumbar spine was
performed. No intravenous contrast was administered.

[Series 2: T2 · sagittal · 4.0mm · 1.13mm/px · 5 of 16 slices shown (1 of 2)]
[im 1/16]
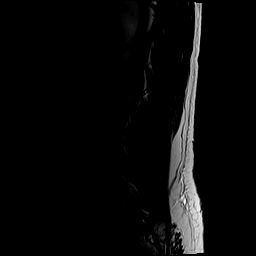
[im 4/16]
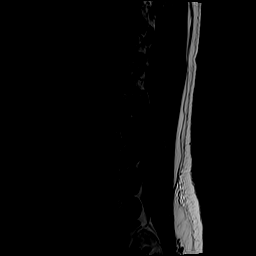
[im 8/16]
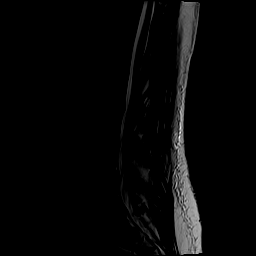
[im 12/16]
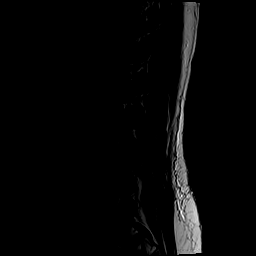
[im 16/16]
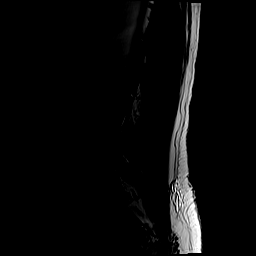

[Series 4: T1 · sagittal · 4.0mm · 1.09mm/px · 5 of 16 slices shown (1 of 2)]
[im 1/16]
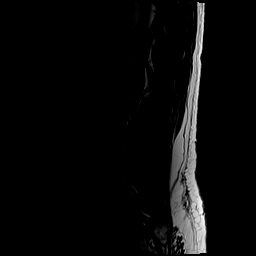
[im 4/16]
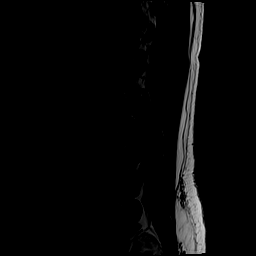
[im 8/16]
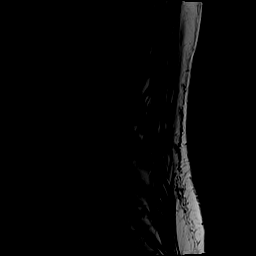
[im 12/16]
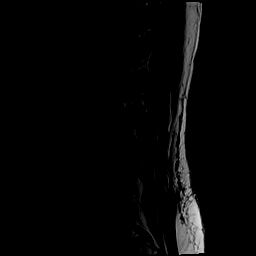
[im 16/16]
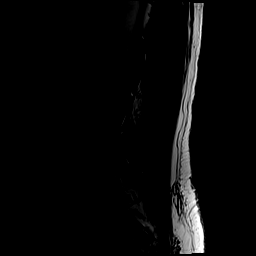

[Series 5: T2 · axial · 4.0mm · 0.39mm/px · z∈[-92,+157]mm · 8 of 48 slices shown (2 of 2)]
[im 1/48]
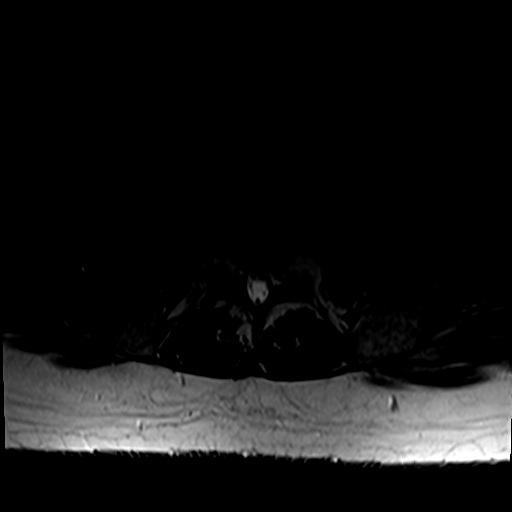
[im 8/48]
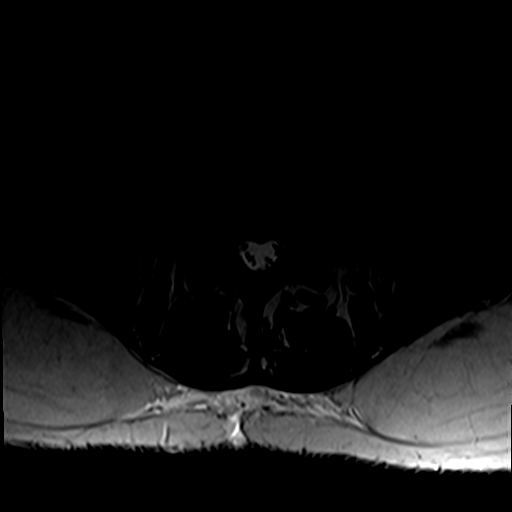
[im 15/48]
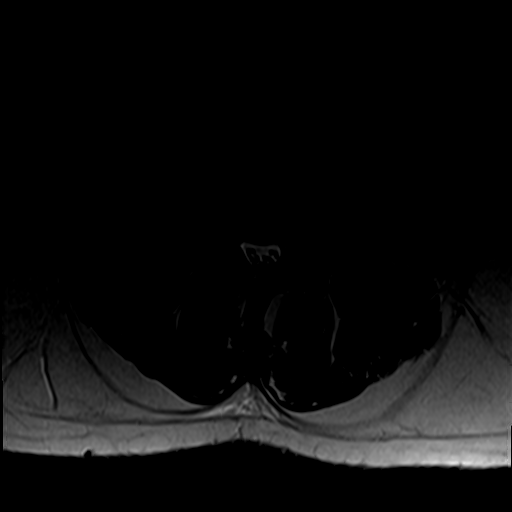
[im 22/48]
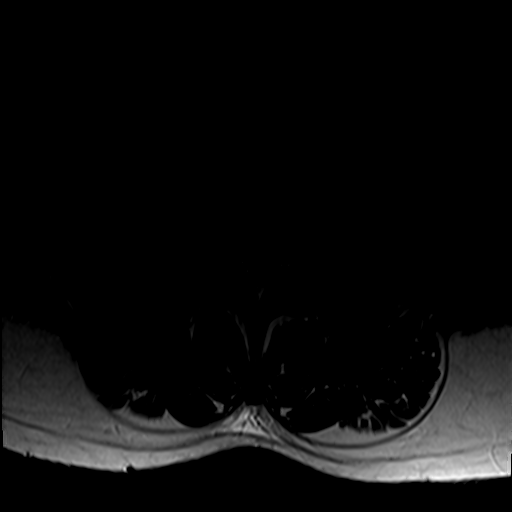
[im 26/48]
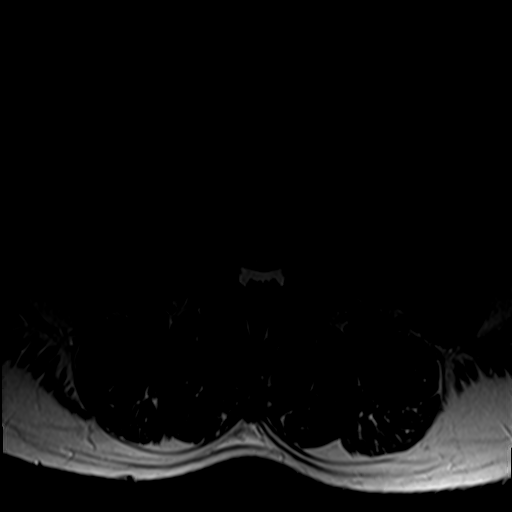
[im 33/48]
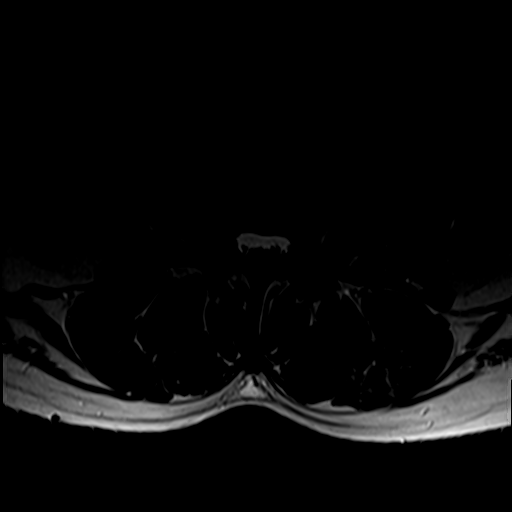
[im 40/48]
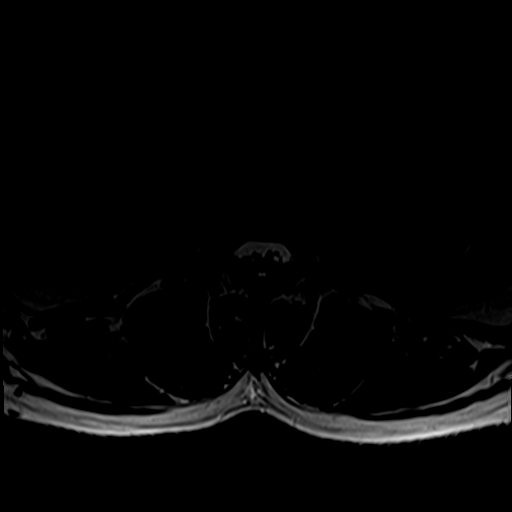
[im 48/48]
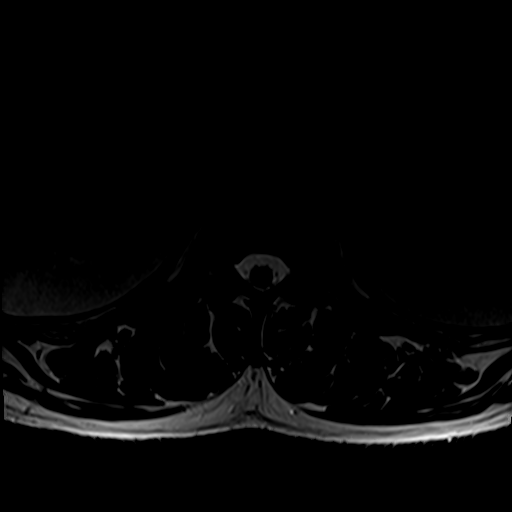

[Series 6: T1 · axial · 4.0mm · 0.39mm/px · z∈[-92,+118]mm · 7 of 48 slices shown (2 of 2)]
[im 1/48]
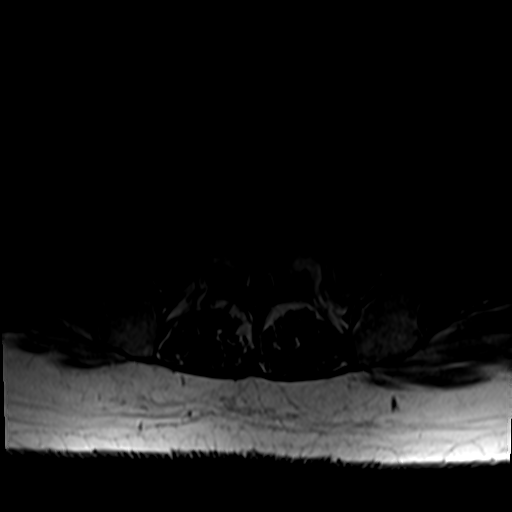
[im 8/48]
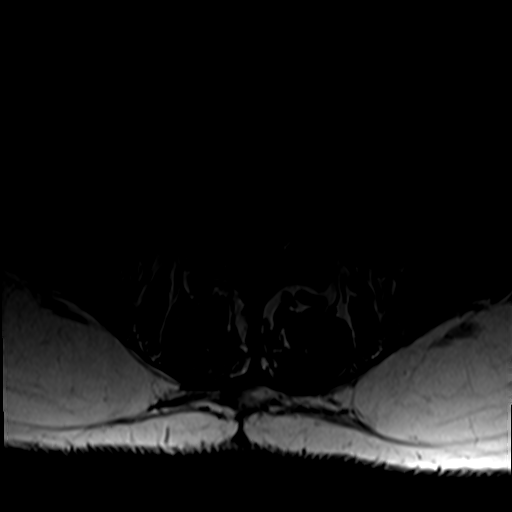
[im 15/48]
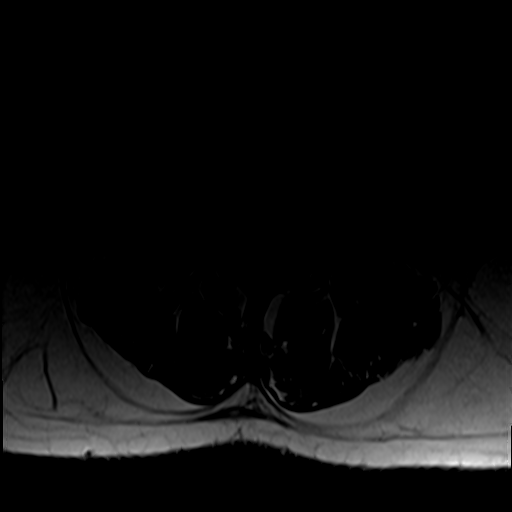
[im 22/48]
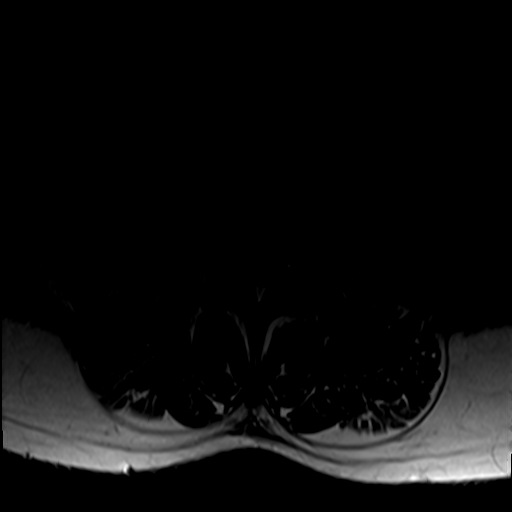
[im 26/48]
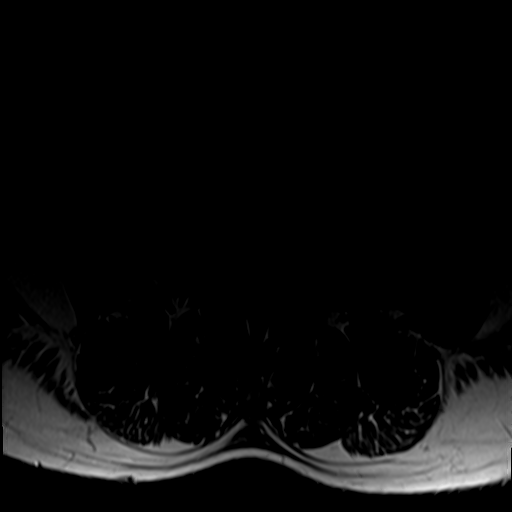
[im 33/48]
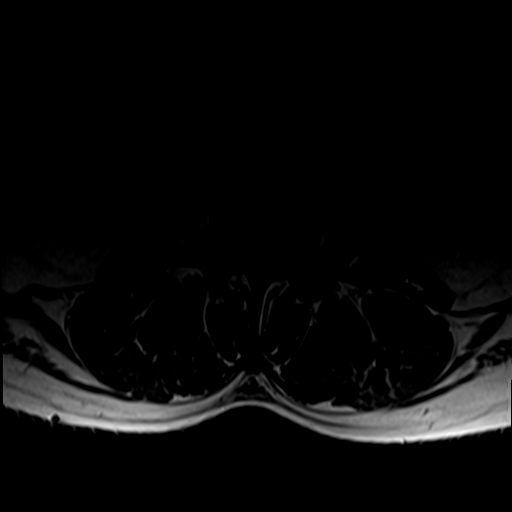
[im 40/48]
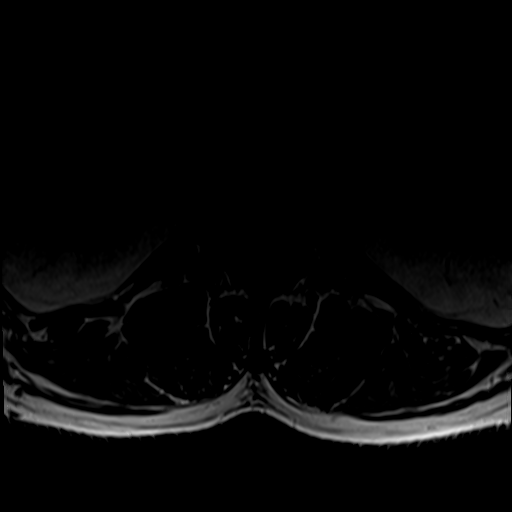

[25 of 48 positions shown; findings below may reference images not displayed]

FINDINGS: Segmentation:  5 lumbar type vertebral bodies.

Alignment:  Normal

Vertebrae:  No fracture or primary bone lesion.

Conus medullaris and cauda equina: Conus extends to the L1-2 level.
Conus and cauda equina appear normal.

Paraspinal and other soft tissues: Negative

Disc levels:

No disc abnormality at T11-12, T12-L1 or L1-2.

L2-3: Mild desiccation and bulging of the disc. Focal left
extraforaminal disc herniation. No compressive canal stenosis.
Potential that the left L2 nerve could be affected by the
extraforaminal disc herniation.

L3-4: Minimal annular bulging. Mild facet and ligamentous
hypertrophy. Mild lateral recess narrowing without apparent neural
compression. The facet arthritis could contribute to back pain.

L4-5: Disc degeneration with annular bulging and annular fissures.
Mild facet degeneration and hypertrophy with a 3 mm synovial cyst
projecting inward from the facet on the right. Narrowing of both
subarticular lateral recesses that would have some potential to
affect either L5 nerve.

L5-S1: Previous right hemilaminectomy and discectomy. Disc
degeneration with annular bulging more prominent towards the right.
Mild bilateral facet hypertrophy. No apparent compressive stenosis
at this level.
IMPRESSION: L2-3: Mild disc degeneration and disc bulge. Left extraforaminal
disc herniation with some potential to focally affect the left L2
nerve.

L3-4: Minimal annular bulge. Bilateral facet degeneration and
hypertrophy with mild edema. No compressive stenosis. The facet
arthritis could be painful.

L4-5: Annular bulging and annular fissures. Mild facet degeneration
and hypertrophy. 3 mm synovial cyst projecting inward from the facet
on the right. Narrowing of both subarticular lateral recesses that
could affect either L5 nerve.

L5-S1: Previous right hemilaminectomy and discectomy. Annular
bulging more prominent towards the right. No apparent neural
compressive stenosis however. Mild facet osteoarthritis.
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
## 2020-08-31 IMAGING — MR MR LUMBAR SPINE WO/W CM
4 of 9 series · 15 of 48 positions shown · IV contrast (20 MULTIHANCE)
Comparison: Prior MRI from [DATE].

CLINICAL DATA: Initial evaluation for lower back pain with
bilateral lower extremity weakness, worse on the right. History of
prior surgery on [DATE].

EXAM:
MRI LUMBAR SPINE WITHOUT AND WITH CONTRAST
TECHNIQUE: Multiplanar and multiecho pulse sequences of the lumbar spine were
obtained without and with intravenous contrast.
CONTRAST:  20mL MULTIHANCE GADOBENATE DIMEGLUMINE 529 MG/ML IV SOLN

[Series 5: T1 · sagittal · 4.0mm · 0.73mm/px · 3 of 15 slices shown (1 of 2)]
[im 1/15]
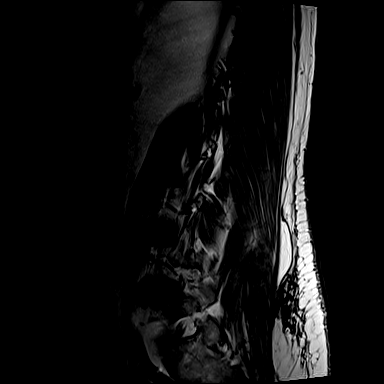
[im 10/15]
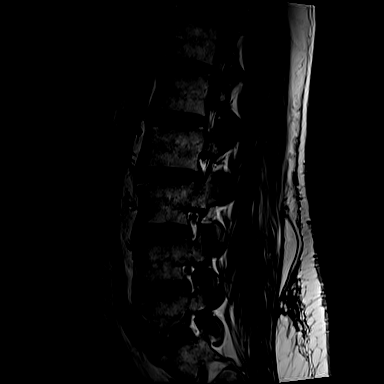
[im 15/15]
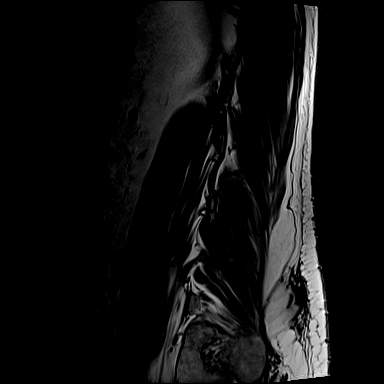

[Series 7: T1 · axial · 4.0mm · 0.28mm/px · z∈[+50,+149]mm · 3 of 32 slices shown (2 of 2)]
[im 6/32]
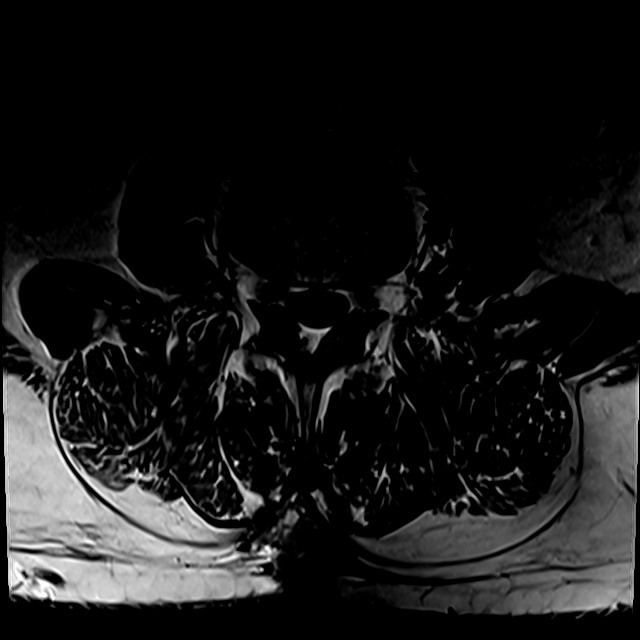
[im 16/32]
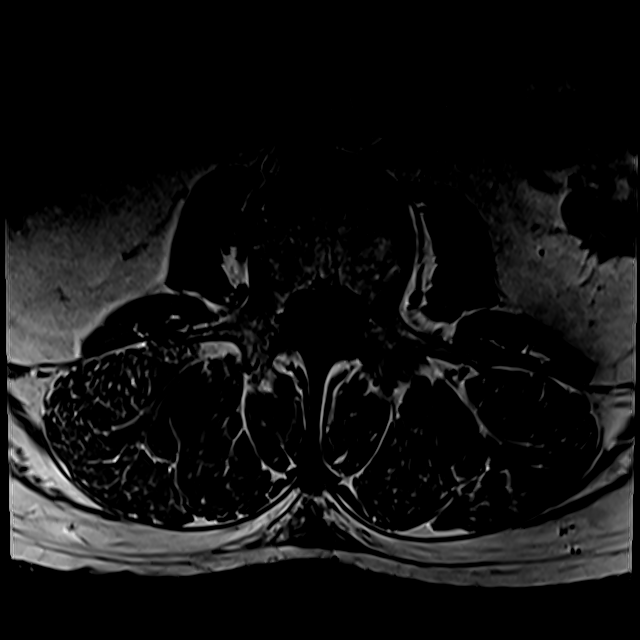
[im 26/32]
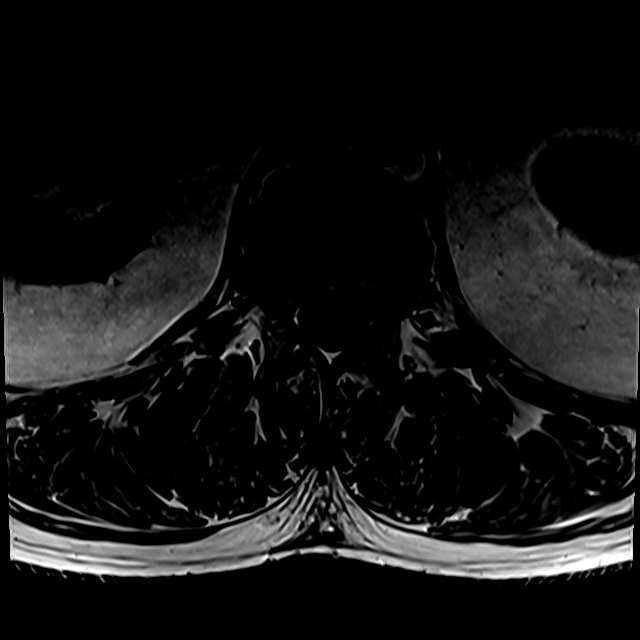

[Series 12: T2 · axial · 4.0mm · 0.28mm/px · z∈[-70,+149]mm · 6 of 48 slices shown (1 of 2)]
[im 1/48]
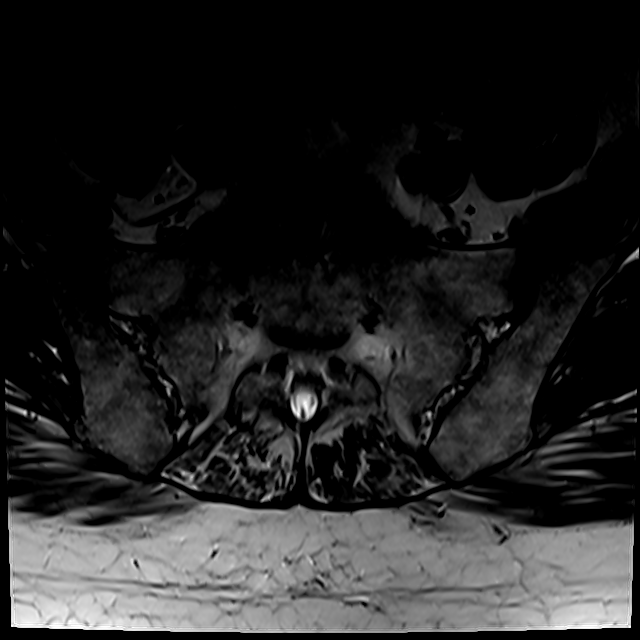
[im 6/48]
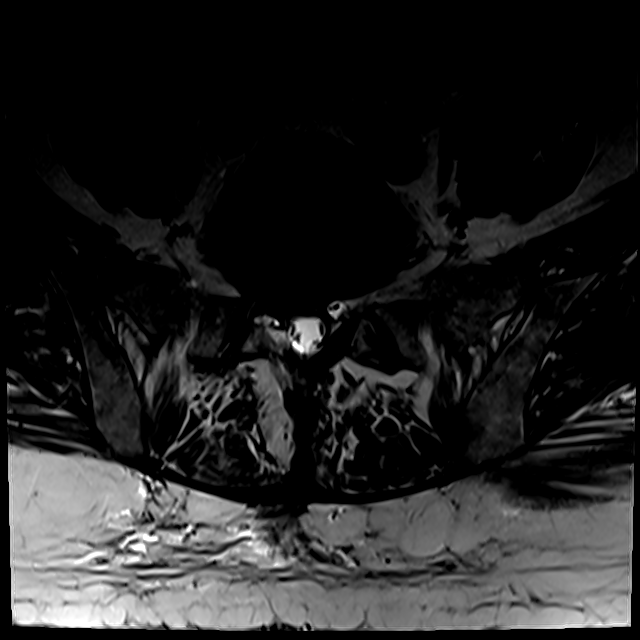
[im 16/48]
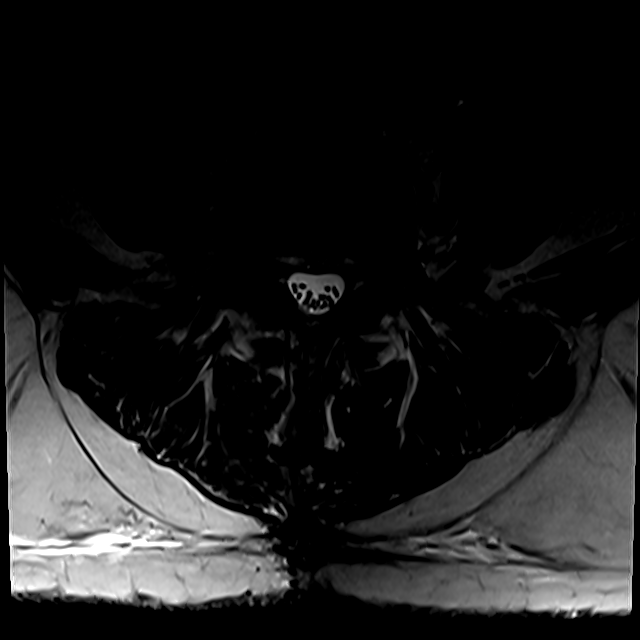
[im 21/48]
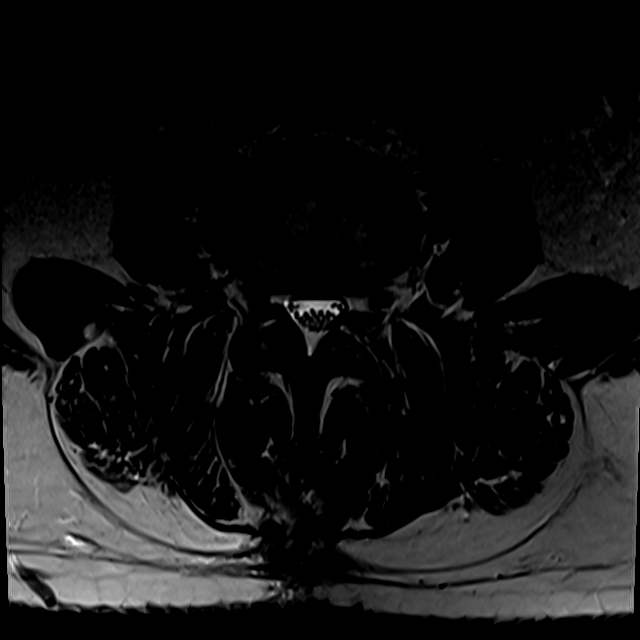
[im 27/48]
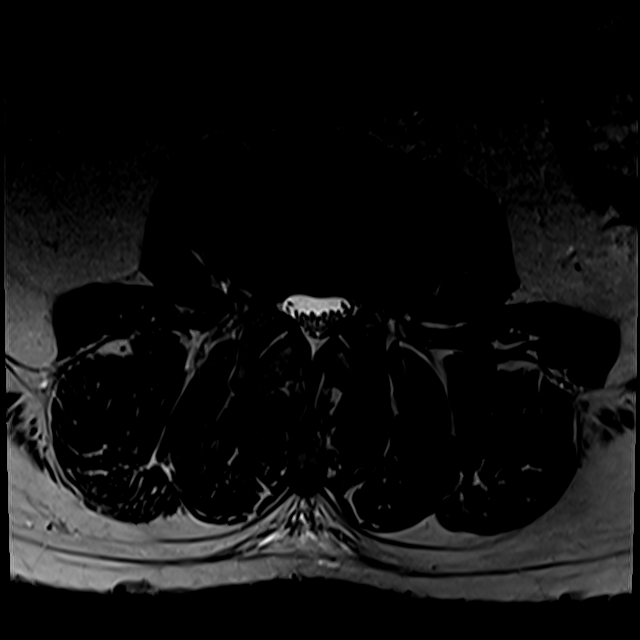
[im 42/48]
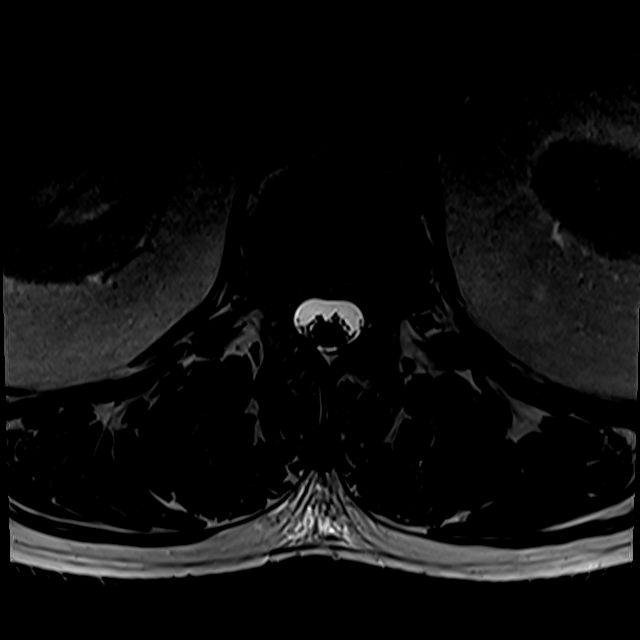

[Series 13: T2 · sagittal · 4.0mm · 0.73mm/px · 3 of 15 slices shown (2 of 2)]
[im 1/15]
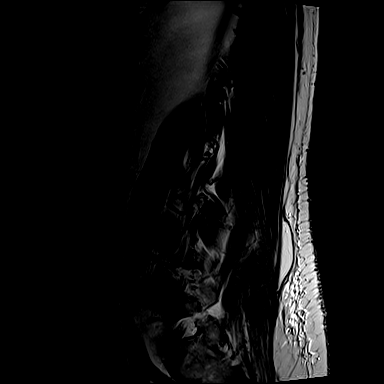
[im 8/15]
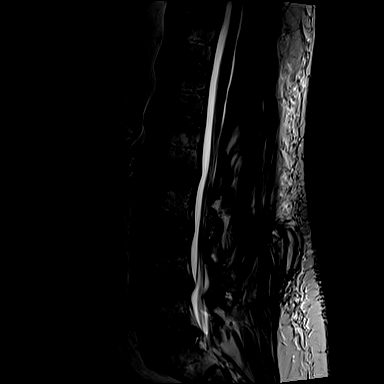
[im 15/15]
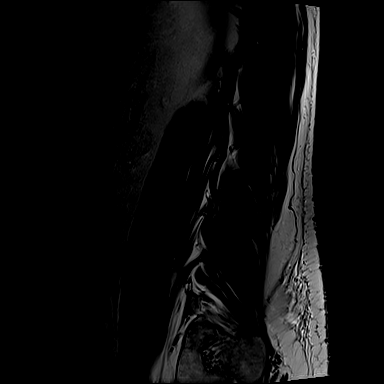

[15 of 48 positions shown; findings below may reference images not displayed]

FINDINGS: Segmentation:  Standard.

Alignment:  Physiologic.  No listhesis.

Vertebrae: Vertebral body height maintained without acute or chronic
fracture. Bone marrow signal intensity within normal limits. No
discrete or worrisome osseous lesions. Mild discogenic reactive
marrow edema and enhancement seen about the L5-S1 interspace
posteriorly. No other abnormal marrow edema.

Conus medullaris and cauda equina: Conus extends to the L2 level.
Conus and cauda equina appear normal.

Paraspinal and other soft tissues: Postoperative changes from
interval posterior decompression seen at L4-5. Additional chronic
postoperative changes seen on the right at L5-S1. Paraspinous soft
tissues demonstrate no acute finding. Multiple parapelvic cyst noted
within the visualized left kidney. Visualized visceral structures
otherwise unremarkable.

Disc levels:

L1-2: Normal interspace. Mild bilateral facet hypertrophy. No canal
or foraminal stenosis.

L2-3: Mild diffuse disc bulge with disc desiccation. Superimposed
left extraforaminal disc protrusion contacts the exiting left L2
nerve root as it courses of the left neural foramen. Mild facet
hypertrophy. No significant spinal stenosis. Foramina remain patent.
Appearance is stable.

L3-4: Minimal annular bulge. Mild to moderate right worse than left
facet hypertrophy. Mild narrowing of the lateral recesses
bilaterally, slightly greater on the right. No frank neural
impingement. Central canal remains patent. Mild right L3 foraminal
stenosis. Left neural foramina remains patent. Appearance is
relatively stable.

L4-5: Mild disc bulge with disc desiccation. Associated bilateral
extraforaminal annular fissures noted, right larger than left.
Superimposed small central disc protrusion mildly indents the
ventral thecal sac. Postoperative changes from interval wide
posterior decompressive laminectomy. Residual moderate facet
arthrosis. Persistent mild narrowing of the lateral recesses
bilaterally. Central canal remains widely patent. Mild bilateral L4
foraminal stenosis without frank impingement.

L5-S1: Degenerative intervertebral disc space narrowing with mild
disc bulge and disc desiccation. Disc bulge slightly asymmetric to
the right. Sequelae of prior right hemi laminectomy and micro
discectomy with postoperative granulation tissue within the right
lateral recess, partially surrounding the descending right S1 nerve
root. No residual or recurrent disc herniation. Mild bilateral facet
hypertrophy. Canal and lateral recesses remain patent. No foraminal
encroachment.
IMPRESSION: 1. Postoperative changes from interval posterior decompression at
L4-5. Persistent mild bilateral lateral recess stenosis at this
level without frank neural impingement.
2. Sequelae of prior right hemi laminectomy and micro discectomy at
L5-S1 without residual or recurrent disc herniation.
3. Left extraforaminal disc protrusion at L2-3, contacting and
potentially irritating the exiting left L2 nerve root as it courses
of the left neural foramen. Finding is similar to previous.
4. Mild-to-moderate multilevel facet hypertrophy throughout the
lumbar spine as above. Finding could contribute to underlying lower
back pain.
